# Patient Record
Sex: Male | Born: 1995 | Race: Black or African American | Hispanic: No | Marital: Single | Smoking: Never smoker
Health system: Southern US, Community
[De-identification: ages and names within clinical notes are randomized; demographics above are authoritative.]

## PROBLEM LIST (undated history)

## (undated) ENCOUNTER — Ambulatory Visit: Admission: EM | Payer: Self-pay | Source: Home / Self Care

## (undated) HISTORY — PX: HERNIA REPAIR: SHX51

---

## 2019-08-24 ENCOUNTER — Emergency Department: Payer: Medicaid Other

## 2019-08-24 ENCOUNTER — Other Ambulatory Visit: Payer: Self-pay

## 2019-08-24 ENCOUNTER — Encounter: Payer: Self-pay | Admitting: Emergency Medicine

## 2019-08-24 ENCOUNTER — Emergency Department
Admission: EM | Admit: 2019-08-24 | Discharge: 2019-08-24 | Disposition: A | Discharge status: Home / Self Care | Payer: Medicaid Other | Attending: Emergency Medicine | Admitting: Emergency Medicine

## 2019-08-24 DIAGNOSIS — S8002XA Contusion of left knee, initial encounter: Secondary | ICD-10-CM | POA: Insufficient documentation

## 2019-08-24 DIAGNOSIS — Y92410 Unspecified street and highway as the place of occurrence of the external cause: Secondary | ICD-10-CM | POA: Diagnosis not present

## 2019-08-24 DIAGNOSIS — S8992XA Unspecified injury of left lower leg, initial encounter: Secondary | ICD-10-CM | POA: Diagnosis present

## 2019-08-24 DIAGNOSIS — Y93I9 Activity, other involving external motion: Secondary | ICD-10-CM | POA: Diagnosis not present

## 2019-08-24 DIAGNOSIS — Y998 Other external cause status: Secondary | ICD-10-CM | POA: Insufficient documentation

## 2019-08-24 MED ORDER — IBUPROFEN 800 MG PO TABS: 800.0000 mg | ORAL_TABLET | Freq: Three times a day (TID) | ORAL | 0 refills | Status: DC | PRN

## 2019-08-24 NOTE — ED Notes (Signed)
No bruising or swelling noted on the left leg where pt is stating he is having pain. Pt able to bend knee and ankle joints during assessment.

## 2019-08-24 NOTE — ED Notes (Signed)
Pt verbalized understanding of discharge instructions. NAD at this time. 
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
## 2019-08-24 NOTE — Discharge Instructions (Signed)
Follow-up with your regular doctor Dr. Sabra Heck if not better in 5 to 7 days.  Return emergency department worsening.

## 2019-08-24 NOTE — ED Notes (Signed)
Ace wrap applied to left knee by this RN
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
## 2019-10-11 ENCOUNTER — Emergency Department
Admission: EM | Admit: 2019-10-11 | Discharge: 2019-10-11 | Disposition: A | Discharge status: Home / Self Care | Payer: Medicaid Other | Attending: Emergency Medicine | Admitting: Emergency Medicine

## 2019-10-11 ENCOUNTER — Encounter: Payer: Self-pay | Admitting: Emergency Medicine

## 2019-10-11 ENCOUNTER — Other Ambulatory Visit: Payer: Self-pay

## 2019-10-11 DIAGNOSIS — M25562 Pain in left knee: Secondary | ICD-10-CM | POA: Diagnosis present

## 2019-10-11 DIAGNOSIS — M25462 Effusion, left knee: Secondary | ICD-10-CM | POA: Diagnosis not present

## 2019-10-11 MED ORDER — NAPROXEN 500 MG PO TABS: 500.0000 mg | ORAL_TABLET | Freq: Two times a day (BID) | ORAL | Status: DC

## 2019-10-11 NOTE — Discharge Instructions (Addendum)
Follow discharge care instruction and wear knee brace until evaluation by orthopedics.  Call Monday morning and tell them you follow-up in the emergency room.
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
## 2021-01-01 IMAGING — DX LEFT FOOT - COMPLETE 3+ VIEW
3 series · 3 of 3 positions shown · non-contrast
Comparison: None.

CLINICAL DATA: Pain after trauma

EXAM:
LEFT FOOT - COMPLETE 3+ VIEW

[foot ap]
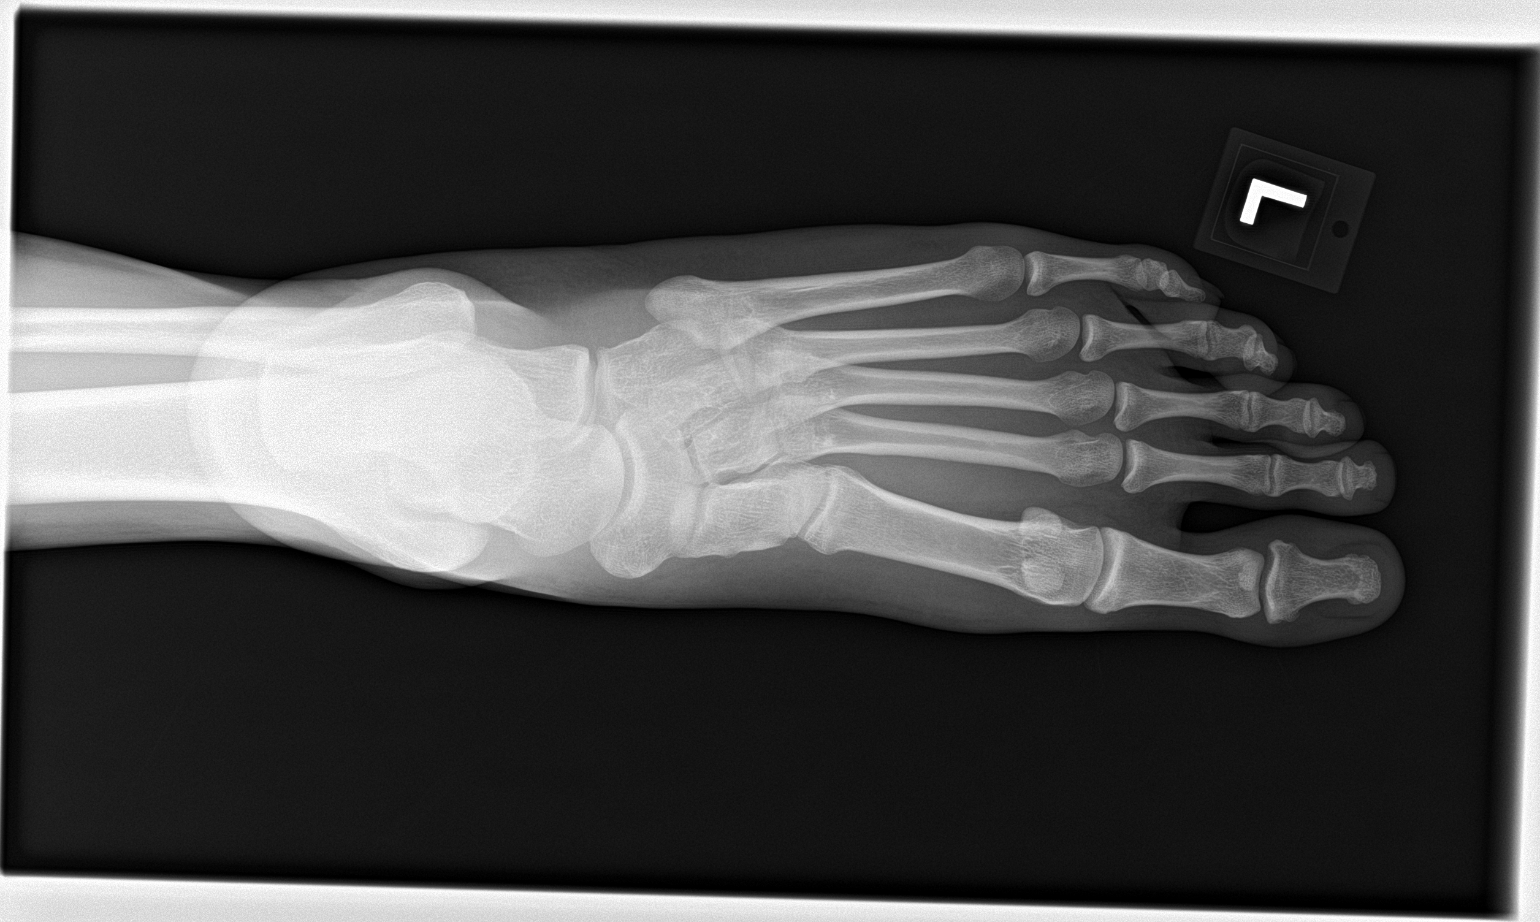

[foot obl]
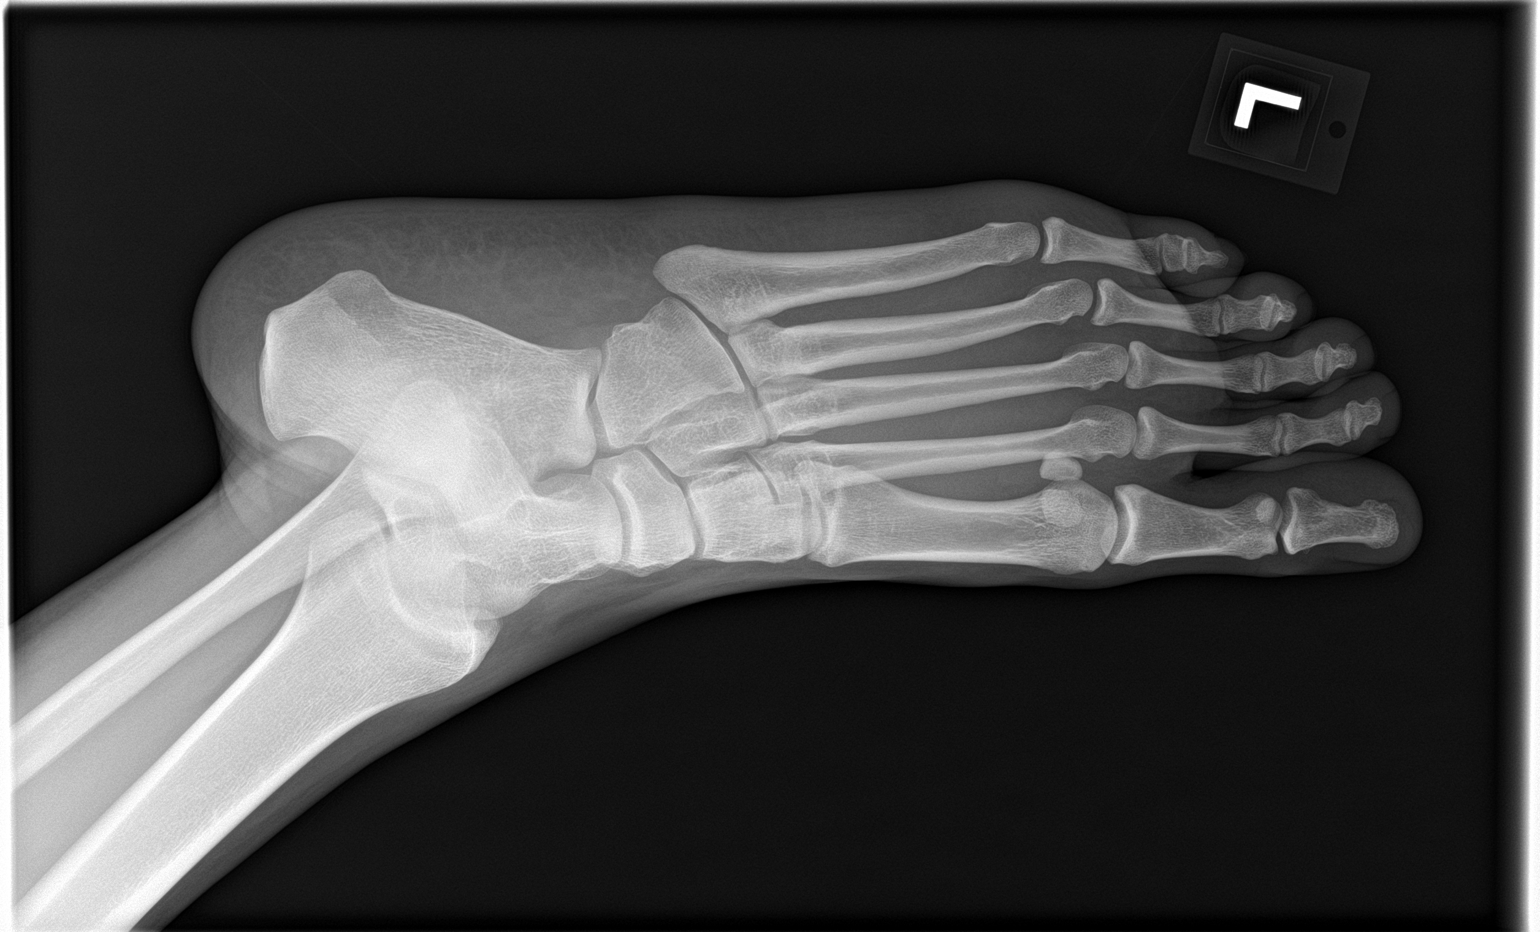

[foot lat]
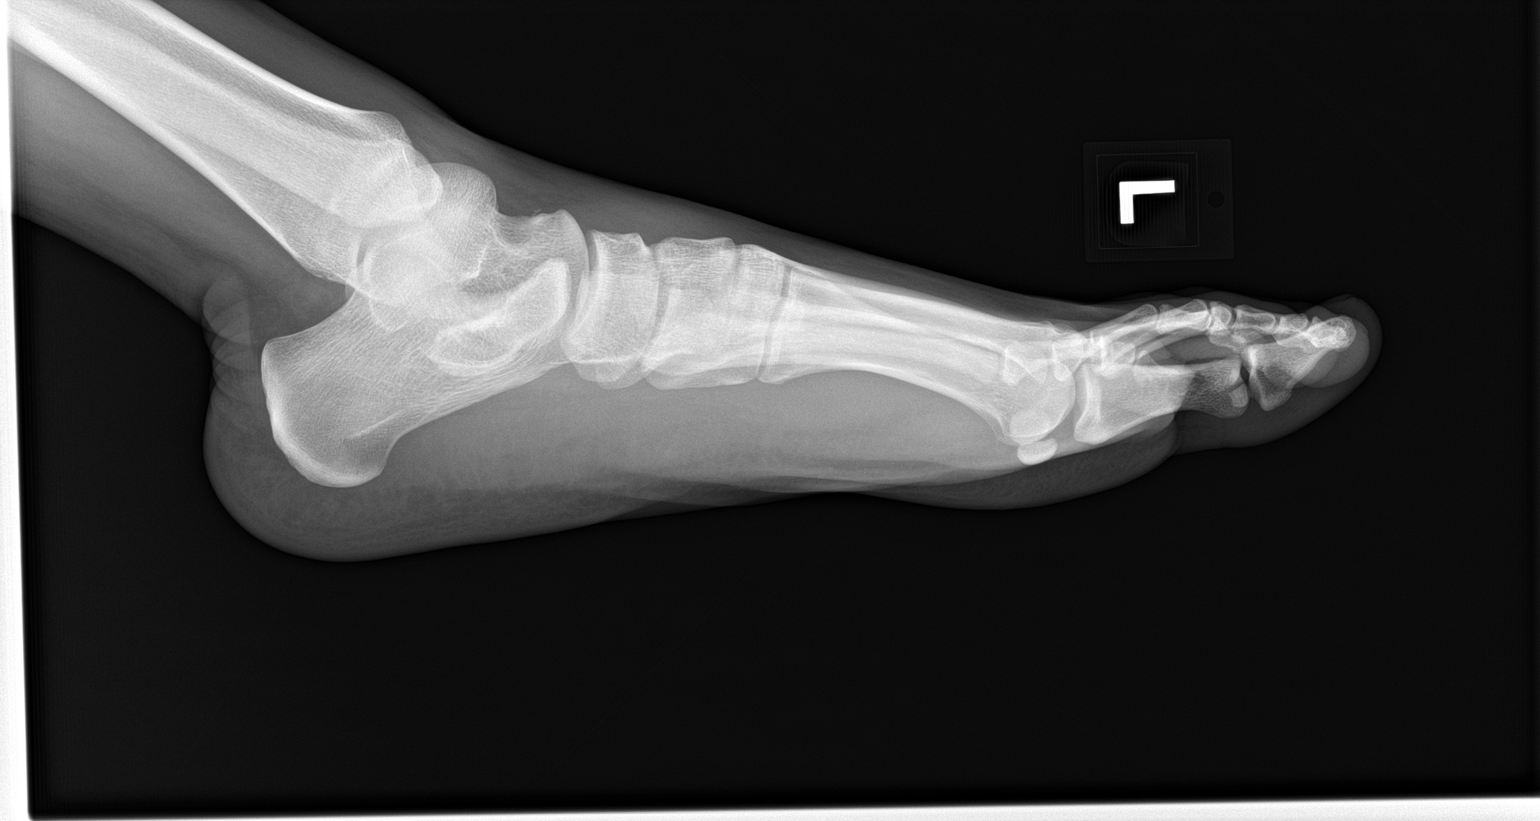

[3 of 3 positions shown; findings below may reference images not displayed]

FINDINGS: There is no evidence of fracture or dislocation. There is no
evidence of arthropathy or other focal bone abnormality. Soft
tissues are unremarkable.
IMPRESSION: Negative.

## 2021-10-27 ENCOUNTER — Emergency Department
Admission: EM | Admit: 2021-10-27 | Discharge: 2021-10-27 | Disposition: A | Discharge status: Home / Self Care | Payer: Medicaid Other | Attending: Emergency Medicine | Admitting: Emergency Medicine

## 2021-10-27 ENCOUNTER — Other Ambulatory Visit: Payer: Self-pay

## 2021-10-27 DIAGNOSIS — N390 Urinary tract infection, site not specified: Secondary | ICD-10-CM | POA: Diagnosis not present

## 2021-10-27 DIAGNOSIS — R110 Nausea: Secondary | ICD-10-CM | POA: Diagnosis not present

## 2021-10-27 DIAGNOSIS — R1031 Right lower quadrant pain: Secondary | ICD-10-CM | POA: Diagnosis present

## 2021-10-27 DIAGNOSIS — R1030 Lower abdominal pain, unspecified: Secondary | ICD-10-CM

## 2021-10-27 LAB — URINALYSIS, ROUTINE W REFLEX MICROSCOPIC
Bilirubin Urine: NEGATIVE
Glucose, UA: NEGATIVE mg/dL
Hgb urine dipstick: NEGATIVE
Ketones, ur: 20 mg/dL — AB
Nitrite: NEGATIVE
Protein, ur: NEGATIVE mg/dL
Specific Gravity, Urine: 1.034 — ABNORMAL HIGH (ref 1.005–1.030)
pH: 5 (ref 5.0–8.0)

## 2021-10-27 LAB — COMPREHENSIVE METABOLIC PANEL
ALT: 15 U/L (ref 0–44)
AST: 22 U/L (ref 15–41)
Albumin: 4.4 g/dL (ref 3.5–5.0)
Alkaline Phosphatase: 40 U/L (ref 38–126)
Anion gap: 7 (ref 5–15)
BUN: 18 mg/dL (ref 6–20)
CO2: 28 mmol/L (ref 22–32)
Calcium: 9.1 mg/dL (ref 8.9–10.3)
Chloride: 103 mmol/L (ref 98–111)
Creatinine, Ser: 1.03 mg/dL (ref 0.61–1.24)
GFR, Estimated: >60 mL/min (ref >60)
Glucose, Bld: 92 mg/dL (ref 70–99)
Potassium: 3.6 mmol/L (ref 3.5–5.1)
Sodium: 138 mmol/L (ref 135–145)
Total Bilirubin: 1.4 mg/dL — ABNORMAL HIGH (ref 0.3–1.2)
Total Protein: 7.4 g/dL (ref 6.5–8.1)

## 2021-10-27 LAB — LIPASE, BLOOD: Lipase: 29 U/L (ref 11–51)

## 2021-10-27 LAB — CBC
HCT: 42.7 % (ref 39.0–52.0)
Hemoglobin: 14.4 g/dL (ref 13.0–17.0)
MCH: 29.7 pg (ref 26.0–34.0)
MCHC: 33.7 g/dL (ref 30.0–36.0)
MCV: 88 fL (ref 80.0–100.0)
Platelets: 179 10*3/uL (ref 150–400)
RBC: 4.85 MIL/uL (ref 4.22–5.81)
RDW: 12.2 % (ref 11.5–15.5)
WBC: 5.3 10*3/uL (ref 4.0–10.5)
nRBC: 0 % (ref 0.0–0.2)

## 2021-10-27 MED ORDER — CEFDINIR 300 MG PO CAPS: 300.0000 mg | ORAL_CAPSULE | Freq: Two times a day (BID) | ORAL | 0 refills | Status: AC

## 2021-10-27 NOTE — ED Triage Notes (Signed)
Pt comes with c/o abdominal pain that started last night. Pt states he thinks it might be his diverticulitis. Pt states some yellow phlegm last night.

## 2021-10-27 NOTE — ED Provider Notes (Signed)
[facility] Emergency Department Provider Note   ____________________________________________   Event Date/Time   First MD Initiated Contact with Patient 10/27/21 [phone]     (approximate)  I have reviewed the triage vital signs and the nursing notes.   HISTORY  Chief Complaint Abdominal Pain    HPI Stephen Washington is a 25 y.o. male who presents for lower abdominal pain  LOCATION: Lower abdomen DURATION: 1 day prior to arrival TIMING: Intermittent and worsening since onset SEVERITY: Moderate QUALITY: Dull aching pain CONTEXT: Patient states he began having bilateral lower quadrant and suprapubic abdominal pain that began yesterday and has been intermittent and worsening since onset with associated mild nausea MODIFYING FACTORS: Denies any modifying factors ASSOCIATED SYMPTOMS: Polyuria, dysuria   Per medical record review, patient has history of of previous surgery for Meckel's diverticulum and inguinal hernia surgery          History reviewed. No pertinent past medical history.  There are no problems to display for this patient.   History reviewed. No pertinent surgical history.  Prior to Admission medications   Medication Sig Start Date End Date Taking? Authorizing Provider  cefdinir (OMNICEF) 300 MG capsule Take 1 capsule (300 mg total) by mouth 2 (two) times daily for 5 days. 10/27/21 11/01/21 Yes Merwyn Katos, MD  naproxen (NAPROSYN) 500 MG tablet Take 1 tablet (500 mg total) by mouth 2 (two) times daily with a meal. 10/11/19   Joni Reining, PA-C    Allergies Other  No family history on file.  Social History Social History   Tobacco Use   Smoking status: Never   Smokeless tobacco: Never  Vaping Use   Vaping Use: Never used    Review of Systems Constitutional: No fever/chills Eyes: No visual changes. ENT: No sore throat. Cardiovascular: Denies chest pain. Respiratory: Denies shortness of  breath. Gastrointestinal: Endorses lower abdominal pain.  No nausea, no vomiting.  No diarrhea. Genitourinary: Positive for dysuria. Musculoskeletal: Negative for acute arthralgias Skin: Negative for rash. Neurological: Negative for headaches, weakness/numbness/paresthesias in any extremity Psychiatric: Negative for suicidal ideation/homicidal ideation   ____________________________________________   PHYSICAL EXAM:  VITAL SIGNS: ED Triage Vitals  Enc Vitals Group     BP 10/27/21 0816 (!) 133/91     Pulse Rate 10/27/21 0816 63     Resp 10/27/21 0816 18     Temp 10/27/21 0816 98 F (36.7 C)     Temp src --      SpO2 10/27/21 0816 100 %     Weight --      Height --      Head Circumference --      Peak Flow --      Pain Score 10/27/21 0810 10     Pain Loc --      Pain Edu? --      Excl. in GC? --    Constitutional: Alert and oriented. Well appearing and in no acute distress. Eyes: Conjunctivae are normal. PERRL. Head: Atraumatic. Nose: No congestion/rhinnorhea. Mouth/Throat: Mucous membranes are moist. Neck: No stridor Cardiovascular: Grossly normal heart sounds.  Good peripheral circulation. Respiratory: Normal respiratory effort.  No retractions. Gastrointestinal: Soft and nontender. No distention. Musculoskeletal: No obvious deformities Neurologic:  Normal speech and language. No gross focal neurologic deficits are appreciated. Skin:  Skin is warm and dry. No rash noted. Psychiatric: Mood and affect are normal. Speech and behavior are normal.  ____________________________________________   LABS (all labs ordered are listed, but only abnormal results  are displayed)  Labs Reviewed  COMPREHENSIVE METABOLIC PANEL - Abnormal; Notable for the following components:      Result Value   Total Bilirubin 1.4 (*)    All other components within normal limits  URINALYSIS, ROUTINE W REFLEX MICROSCOPIC - Abnormal; Notable for the following components:   Color, Urine YELLOW  (*)    APPearance HAZY (*)    Specific Gravity, Urine 1.034 (*)    Ketones, ur 20 (*)    Leukocytes,Ua TRACE (*)    Bacteria, UA RARE (*)    All other components within normal limits  LIPASE, BLOOD  CBC   ____________________________________________  PROCEDURES  Procedure(s) performed (including Critical Care):  Procedures   ____________________________________________   INITIAL IMPRESSION / ASSESSMENT AND PLAN / ED COURSE  As part of my medical decision making, I reviewed the following data within the electronic medical record, if available:  Nursing notes reviewed and incorporated, Labs reviewed, EKG interpreted, Old chart reviewed, Radiograph reviewed and Notes from prior ED visits reviewed and incorporated        No e/o epididymo-orchitis on exam and low suspicion for rectal abscess, prostatitis, other GU deep space infection, gonorrhea/chlamydia. Unlikely Infected Urolithiasis, AAA, cholecystitis, pancreatitis, SBO, appendicitis, or other acute abdomen. Workup: UA: None Rx: Cefdinir 300 mg twice daily x5 days  Disposition: Discharge home. SRP discussed. Advise follow up with primary care provider within 24-72 hours.      ____________________________________________   FINAL CLINICAL IMPRESSION(S) / ED DIAGNOSES  Final diagnoses:  Lower abdominal pain  Lower urinary tract infectious disease  Nausea     ED Discharge Orders          Ordered    cefdinir (OMNICEF) 300 MG capsule  2 times daily        10/27/21 1002             Note:  This document was prepared using Dragon voice recognition software and may include unintentional dictation errors.    Merwyn Katos, MD 10/27/21 1005

## 2022-02-03 ENCOUNTER — Other Ambulatory Visit: Payer: Self-pay

## 2022-02-03 ENCOUNTER — Emergency Department: Payer: Medicaid Other

## 2022-02-03 ENCOUNTER — Emergency Department
Admission: EM | Admit: 2022-02-03 | Discharge: 2022-02-03 | Disposition: A | Discharge status: Home / Self Care | Payer: Medicaid Other | Attending: Emergency Medicine | Admitting: Emergency Medicine

## 2022-02-03 DIAGNOSIS — S61402A Unspecified open wound of left hand, initial encounter: Secondary | ICD-10-CM | POA: Diagnosis not present

## 2022-02-03 DIAGNOSIS — W320XXA Accidental handgun discharge, initial encounter: Secondary | ICD-10-CM | POA: Insufficient documentation

## 2022-02-03 DIAGNOSIS — Z23 Encounter for immunization: Secondary | ICD-10-CM | POA: Insufficient documentation

## 2022-02-03 DIAGNOSIS — W3400XA Accidental discharge from unspecified firearms or gun, initial encounter: Secondary | ICD-10-CM

## 2022-02-03 DIAGNOSIS — S6992XA Unspecified injury of left wrist, hand and finger(s), initial encounter: Secondary | ICD-10-CM | POA: Diagnosis present

## 2022-02-03 LAB — COMPREHENSIVE METABOLIC PANEL
ALT: 21 U/L (ref 0–44)
AST: 26 U/L (ref 15–41)
Albumin: 4.3 g/dL (ref 3.5–5.0)
Alkaline Phosphatase: 54 U/L (ref 38–126)
Anion gap: 9 (ref 5–15)
BUN: 19 mg/dL (ref 6–20)
CO2: 27 mmol/L (ref 22–32)
Calcium: 9.1 mg/dL (ref 8.9–10.3)
Chloride: 102 mmol/L (ref 98–111)
Creatinine, Ser: 1.1 mg/dL (ref 0.61–1.24)
GFR, Estimated: >60 mL/min (ref >60)
Glucose, Bld: 90 mg/dL (ref 70–99)
Potassium: 3.9 mmol/L (ref 3.5–5.1)
Sodium: 138 mmol/L (ref 135–145)
Total Bilirubin: 0.8 mg/dL (ref 0.3–1.2)
Total Protein: 7.6 g/dL (ref 6.5–8.1)

## 2022-02-03 LAB — CBC
HCT: 44.1 % (ref 39.0–52.0)
Hemoglobin: 14.5 g/dL (ref 13.0–17.0)
MCH: 28.5 pg (ref 26.0–34.0)
MCHC: 32.9 g/dL (ref 30.0–36.0)
MCV: 86.8 fL (ref 80.0–100.0)
Platelets: 257 10*3/uL (ref 150–400)
RBC: 5.08 MIL/uL (ref 4.22–5.81)
RDW: 12.3 % (ref 11.5–15.5)
WBC: 7.2 10*3/uL (ref 4.0–10.5)
nRBC: 0 % (ref 0.0–0.2)

## 2022-02-03 MED ORDER — OXYCODONE-ACETAMINOPHEN 5-325 MG PO TABS
1.0000 | ORAL_TABLET | Freq: Once | ORAL | Status: AC
  Administered 2022-02-03: 1 via ORAL
  Filled 2022-02-03: qty 1

## 2022-02-03 MED ORDER — ONDANSETRON 4 MG PO TBDP
4.0000 mg | ORAL_TABLET | Freq: Once | ORAL | Status: AC
  Administered 2022-02-03: 4 mg via ORAL
  Filled 2022-02-03: qty 1

## 2022-02-03 MED ORDER — CEPHALEXIN 500 MG PO CAPS
500.0000 mg | ORAL_CAPSULE | Freq: Once | ORAL | Status: AC
  Administered 2022-02-03: 500 mg via ORAL
  Filled 2022-02-03: qty 1

## 2022-02-03 MED ORDER — CEPHALEXIN 500 MG PO CAPS: 500.0000 mg | ORAL_CAPSULE | Freq: Three times a day (TID) | ORAL | 0 refills | Status: DC

## 2022-02-03 MED ORDER — MICROFIBRILLAR COLL HEMOSTAT EX POWD: 5.0000 g | Freq: Once | CUTANEOUS | Status: DC

## 2022-02-03 MED ORDER — MORPHINE SULFATE (PF) 4 MG/ML IV SOLN
4.0000 mg | Freq: Once | INTRAVENOUS | Status: AC
  Administered 2022-02-03: 4 mg via INTRAMUSCULAR
  Filled 2022-02-03: qty 1

## 2022-02-03 MED ORDER — TETANUS-DIPHTH-ACELL PERTUSSIS 5-2.5-18.5 LF-MCG/0.5 IM SUSY
0.5000 mL | PREFILLED_SYRINGE | Freq: Once | INTRAMUSCULAR | Status: AC
  Administered 2022-02-03: 0.5 mL via INTRAMUSCULAR
  Filled 2022-02-03: qty 0.5

## 2022-02-03 MED ORDER — OXYCODONE-ACETAMINOPHEN 5-325 MG PO TABS: 1.0000 | ORAL_TABLET | ORAL | 0 refills | Status: DC | PRN

## 2022-02-03 MED ORDER — MICROFIBRILLAR COLL HEMOSTAT EX POWD
1.0000 g | Freq: Once | CUTANEOUS | Status: AC
  Administered 2022-02-03: 1 g via TOPICAL

## 2022-02-03 MED ORDER — MICROFIBRILLAR COLL HEMOSTAT EX POWD: 1.0000 g | Freq: Once | CUTANEOUS | Status: DC
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
## 2022-02-03 NOTE — ED Notes (Signed)
Pt's hand wound lightly cleaned and dressed. Pt. Encouraged to elevate on pillow on side of bed.

## 2022-02-03 NOTE — ED Notes (Signed)
Rainbow of blood sent to lab.  
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
## 2022-02-03 NOTE — ED Notes (Signed)
Images being power shared to [facility]
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
## 2022-02-06 LAB — TYPE AND SCREEN
ABO/RH(D): A POS
Antibody Screen: POSITIVE
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
## 2023-08-15 ENCOUNTER — Other Ambulatory Visit: Payer: Self-pay

## 2023-08-15 ENCOUNTER — Emergency Department
Admission: EM | Admit: 2023-08-15 | Discharge: 2023-08-15 | Disposition: A | Discharge status: Home / Self Care | Payer: Medicaid Other | Attending: Emergency Medicine | Admitting: Emergency Medicine

## 2023-08-15 DIAGNOSIS — A749 Chlamydial infection, unspecified: Secondary | ICD-10-CM | POA: Insufficient documentation

## 2023-08-15 LAB — URINALYSIS, W/ REFLEX TO CULTURE (INFECTION SUSPECTED)
Bacteria, UA: NONE SEEN
Bilirubin Urine: NEGATIVE
Glucose, UA: NEGATIVE mg/dL
Hgb urine dipstick: NEGATIVE
Ketones, ur: NEGATIVE mg/dL
Leukocytes,Ua: NEGATIVE
Nitrite: NEGATIVE
Protein, ur: NEGATIVE mg/dL
Specific Gravity, Urine: 1.015 (ref 1.005–1.030)
pH: 6 (ref 5.0–8.0)

## 2023-08-15 LAB — CHLAMYDIA/NGC RT PCR (ARMC ONLY)
Chlamydia Tr: DETECTED — AB
N gonorrhoeae: NOT DETECTED

## 2023-08-15 LAB — HIV ANTIBODY (ROUTINE TESTING W REFLEX): HIV Screen 4th Generation wRfx: NONREACTIVE

## 2023-08-15 MED ORDER — LIDOCAINE HCL (PF) 1 % IJ SOLN
INTRAMUSCULAR | Status: AC
  Filled 2023-08-15: qty 5

## 2023-08-15 MED ORDER — DOXYCYCLINE MONOHYDRATE 100 MG PO TABS
100.0000 mg | ORAL_TABLET | Freq: Two times a day (BID) | ORAL | 0 refills | Status: AC
Start: 2023-08-15 — End: 2023-08-22

## 2023-08-15 MED ORDER — CEFTRIAXONE SODIUM 1 G IJ SOLR
500.0000 mg | Freq: Once | INTRAMUSCULAR | Status: AC
  Administered 2023-08-15: 500 mg via INTRAMUSCULAR
  Filled 2023-08-15: qty 10
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
## 2023-08-15 NOTE — ED Provider Notes (Signed)
[facility] Provider Note    Event Date/Time   First MD Initiated Contact with Patient 08/15/23 1155     (approximate)   History   SEXUALLY TRANSMITTED DISEASE   HPI  Stephen Washington is a 27 y.o. male who presents today with possible exposure to sexually transmitted disease.  He reports that he was told by a previous partner that his partner has chlamydia.  Patient denies any symptoms.  He is requesting to be tested.  He reports that the encounter was 6 days ago.  He denies any burning with urination or urinary discharge.  There are no problems to display for this patient.         Physical Exam   Triage Vital Signs: ED Triage Vitals  Encounter Vitals Group     BP 08/15/23 1143 129/76     Systolic BP Percentile --      Diastolic BP Percentile --      Pulse Rate 08/15/23 1143 70     Resp 08/15/23 1143 18     Temp 08/15/23 1143 97.8 F (36.6 C)     Temp Source 08/15/23 1143 Oral     SpO2 08/15/23 1143 100 %     Weight --      Height --      Head Circumference --      Peak Flow --      Pain Score 08/15/23 1152 0     Pain Loc --      Pain Education --      Exclude from Growth Chart --     Most recent vital signs: Vitals:   08/15/23 1143  BP: 129/76  Pulse: 70  Resp: 18  Temp: 97.8 F (36.6 C)  SpO2: 100%    Physical Exam Vitals and nursing note reviewed.  Constitutional:      General: Awake and alert. No acute distress.    Appearance: Normal appearance. The patient is normal weight.  HENT:     Head: Normocephalic and atraumatic.     Mouth: Mucous membranes are moist.  Eyes:     General: PERRL. Normal EOMs        Right eye: No discharge.        Left eye: No discharge.     Conjunctiva/sclera: Conjunctivae normal.  Cardiovascular:     Rate and Rhythm: Normal rate and regular rhythm.     Pulses: Normal pulses.  Pulmonary:     Effort: Pulmonary effort is normal. No respiratory distress.     Breath sounds: Normal breath  sounds.  Abdominal:     Abdomen is soft. There is no abdominal tenderness. No rebound or guarding. No distention. Patient declined GU exam Musculoskeletal:        General: No swelling. Normal range of motion.     Cervical back: Normal range of motion and neck supple.  Skin:    General: Skin is warm and dry.     Capillary Refill: Capillary refill takes less than 2 seconds.     Findings: No rash.  Neurological:     Mental Status: The patient is awake and alert.      ED Results / Procedures / Treatments   Labs (all labs ordered are listed, but only abnormal results are displayed) Labs Reviewed  CHLAMYDIA/NGC RT PCR ([facility] ONLY)           - Abnormal; Notable for the following components:      Result Value   Chlamydia Tr DETECTED (*)  All other components within normal limits  URINALYSIS, W/ REFLEX TO CULTURE (INFECTION SUSPECTED) - Abnormal; Notable for the following components:   Color, Urine STRAW (*)    APPearance CLEAR (*)    All other components within normal limits  RPR  HIV ANTIBODY (ROUTINE TESTING W REFLEX)     EKG     RADIOLOGY     PROCEDURES:  Critical Care performed:   Procedures   MEDICATIONS ORDERED IN ED: Medications  lidocaine (PF) (XYLOCAINE) 1 % injection (has no administration in time range)  cefTRIAXone (ROCEPHIN) injection 500 mg (500 mg Intramuscular Given 08/15/23 1406)     IMPRESSION / MDM / ASSESSMENT AND PLAN / ED COURSE  I reviewed the triage vital signs and the nursing notes.   Differential diagnosis includes, but is not limited to, exposure to sexually transmitted disease, just urinary tract infection, sexually transmitted disease.  Patient is awake and alert, hemodynamically stable and afebrile.  He declined GU exam understands that I might be missing things without doing a GU exam.  He reports that nothing is unusual down there and does not wish to have a GU exam.  Urinalysis obtained in triage as well as gonorrhea/chlamydia  test.  He also agreed to syphilis and HIV testing.  Chlamydia test was positive.  Patient will be treated empirically with Rocephin and doxycycline.  Patient was advised that there are many other STDs that patient could have, that we do not test for in the emergency department. Patient was advised to follow up with a primary care doctor to have the full panel of testing performed. Patient was advised to not have sexual intercourse until fully and properly tested and treated. Patient was advised that his partner(s) also needs to be tested and treated. Patient understands and agrees with plan.   Patient's presentation is most consistent with acute complicated illness / injury requiring diagnostic workup.   FINAL CLINICAL IMPRESSION(S) / ED DIAGNOSES   Final diagnoses:  Chlamydia     Rx / DC Orders   ED Discharge Orders          Ordered    doxycycline (ADOXA) 100 MG tablet  2 times daily        08/15/23 1353             Note:  This document was prepared using Dragon voice recognition software and may include unintentional dictation errors.   Keturah Shavers 08/15/23 1427    Minna Antis, MD 08/15/23 1525

## 2023-08-15 NOTE — Discharge Instructions (Signed)
Your chlamydia test was positive.  Please take the antibiotics for the full course of treatment.  You may find the results of the syphilis and HIV testing on MyChart. Remember that there are many other STDs that and you should follow up with a primary care doctor to have the full panel of testing performed. Please do not have sexual intercourse until fully and properly tested and treated. Your partner(s) also needs to be tested and treated.  Please inform anyone that you have been sexually active with recently.  Please return for any new, worsening, or change in symptoms or other concerns.  It was a pleasure caring for you today.

## 2023-08-16 LAB — RPR: RPR Ser Ql: NONREACTIVE

## 2024-10-07 ENCOUNTER — Encounter: Payer: Self-pay | Admitting: Emergency Medicine

## 2024-10-07 ENCOUNTER — Emergency Department
Admission: EM | Admit: 2024-10-07 | Discharge: 2024-10-07 | Disposition: A | Discharge status: Home / Self Care | Payer: Worker's Compensation | Attending: Emergency Medicine | Admitting: Emergency Medicine

## 2024-10-07 ENCOUNTER — Other Ambulatory Visit: Payer: Self-pay

## 2024-10-07 DIAGNOSIS — T23062A Burn of unspecified degree of back of left hand, initial encounter: Secondary | ICD-10-CM | POA: Diagnosis present

## 2024-10-07 DIAGNOSIS — S61210A Laceration without foreign body of right index finger without damage to nail, initial encounter: Secondary | ICD-10-CM | POA: Diagnosis not present

## 2024-10-07 DIAGNOSIS — Z23 Encounter for immunization: Secondary | ICD-10-CM | POA: Insufficient documentation

## 2024-10-07 DIAGNOSIS — X16XXXA Contact with hot heating appliances, radiators and pipes, initial encounter: Secondary | ICD-10-CM | POA: Diagnosis not present

## 2024-10-07 DIAGNOSIS — Y990 Civilian activity done for income or pay: Secondary | ICD-10-CM | POA: Insufficient documentation

## 2024-10-07 DIAGNOSIS — T23262A Burn of second degree of back of left hand, initial encounter: Secondary | ICD-10-CM | POA: Insufficient documentation

## 2024-10-07 MED ORDER — SILVER SULFADIAZINE 1 % EX CREA: TOPICAL_CREAM | CUTANEOUS | 1 refills | Status: AC

## 2024-10-07 MED ORDER — SILVER SULFADIAZINE 1 % EX CREA
TOPICAL_CREAM | Freq: Once | CUTANEOUS | Status: AC
  Filled 2024-10-07: qty 20

## 2024-10-07 MED ORDER — TETANUS-DIPHTH-ACELL PERTUSSIS 5-2-15.5 LF-MCG/0.5 IM SUSP
0.5000 mL | Freq: Once | INTRAMUSCULAR | Status: AC
  Administered 2024-10-07: 0.5 mL via INTRAMUSCULAR
  Filled 2024-10-07: qty 0.5
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
## 2024-10-07 NOTE — Discharge Instructions (Addendum)
 Follow-up with the or wound care center in 3 days for wound check.
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
